# Patient Record
Sex: Female | Born: 1985 | Race: White | Hispanic: No | Smoking: Former smoker
Health system: Southern US, Community
[De-identification: ages and names within clinical notes are randomized; demographics above are authoritative.]

---

## 2010-07-30 ENCOUNTER — Emergency Department: Payer: Self-pay | Admitting: Internal Medicine

## 2011-09-23 IMAGING — CR DG LUMBAR SPINE 2-3V
1 series · 3 of 3 positions shown · non-contrast
Comparison: none

REASON FOR EXAM: back pain
COMMENTS:   May transport without cardiac monitor

PROCEDURE:     DXR - DXR LUMBAR SPINE AP AND LATERAL  - July 30, 2010  [DATE]
RESULT:     The vertebral body heights and the intervertebral disc spaces
are well maintained. The vertebral body alignment is normal. The pedicles
are bilaterally intact.

[Series 1: view not recorded · 0.17mm/px · 3 of 3 slices shown]
[im 1/3]
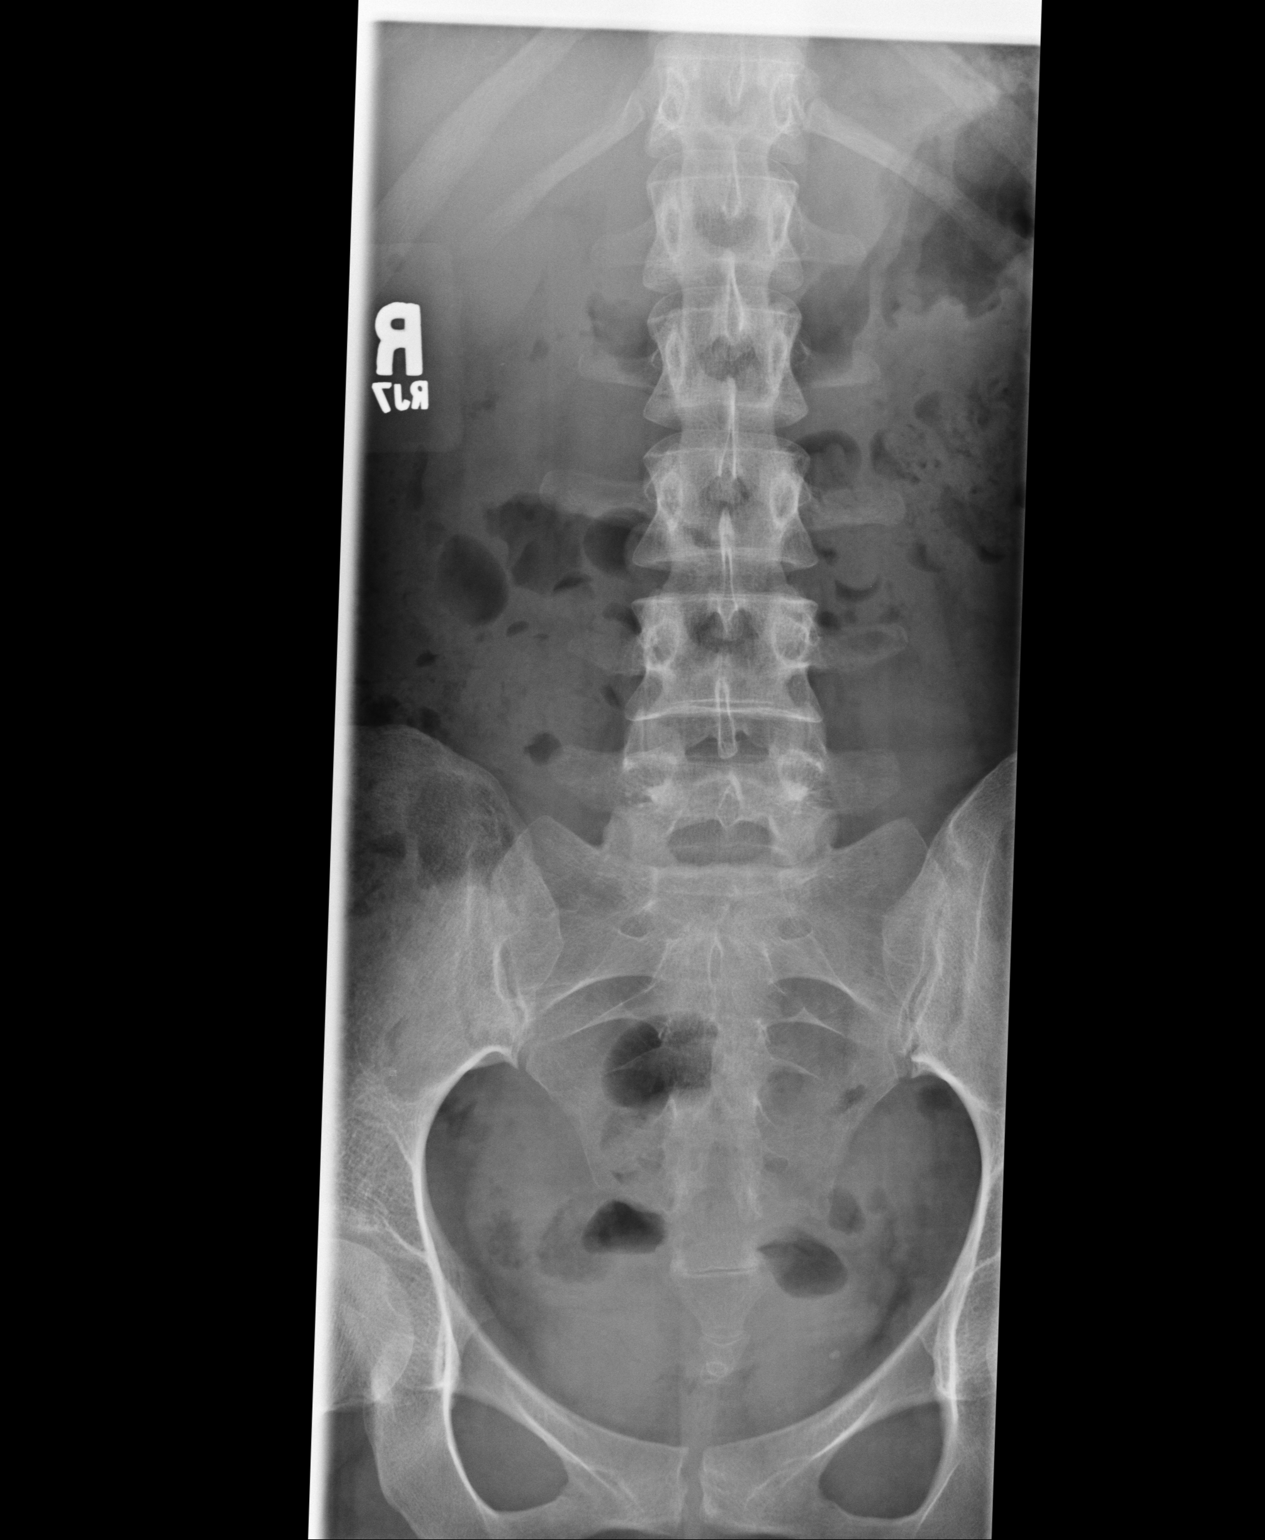
[im 2/3]
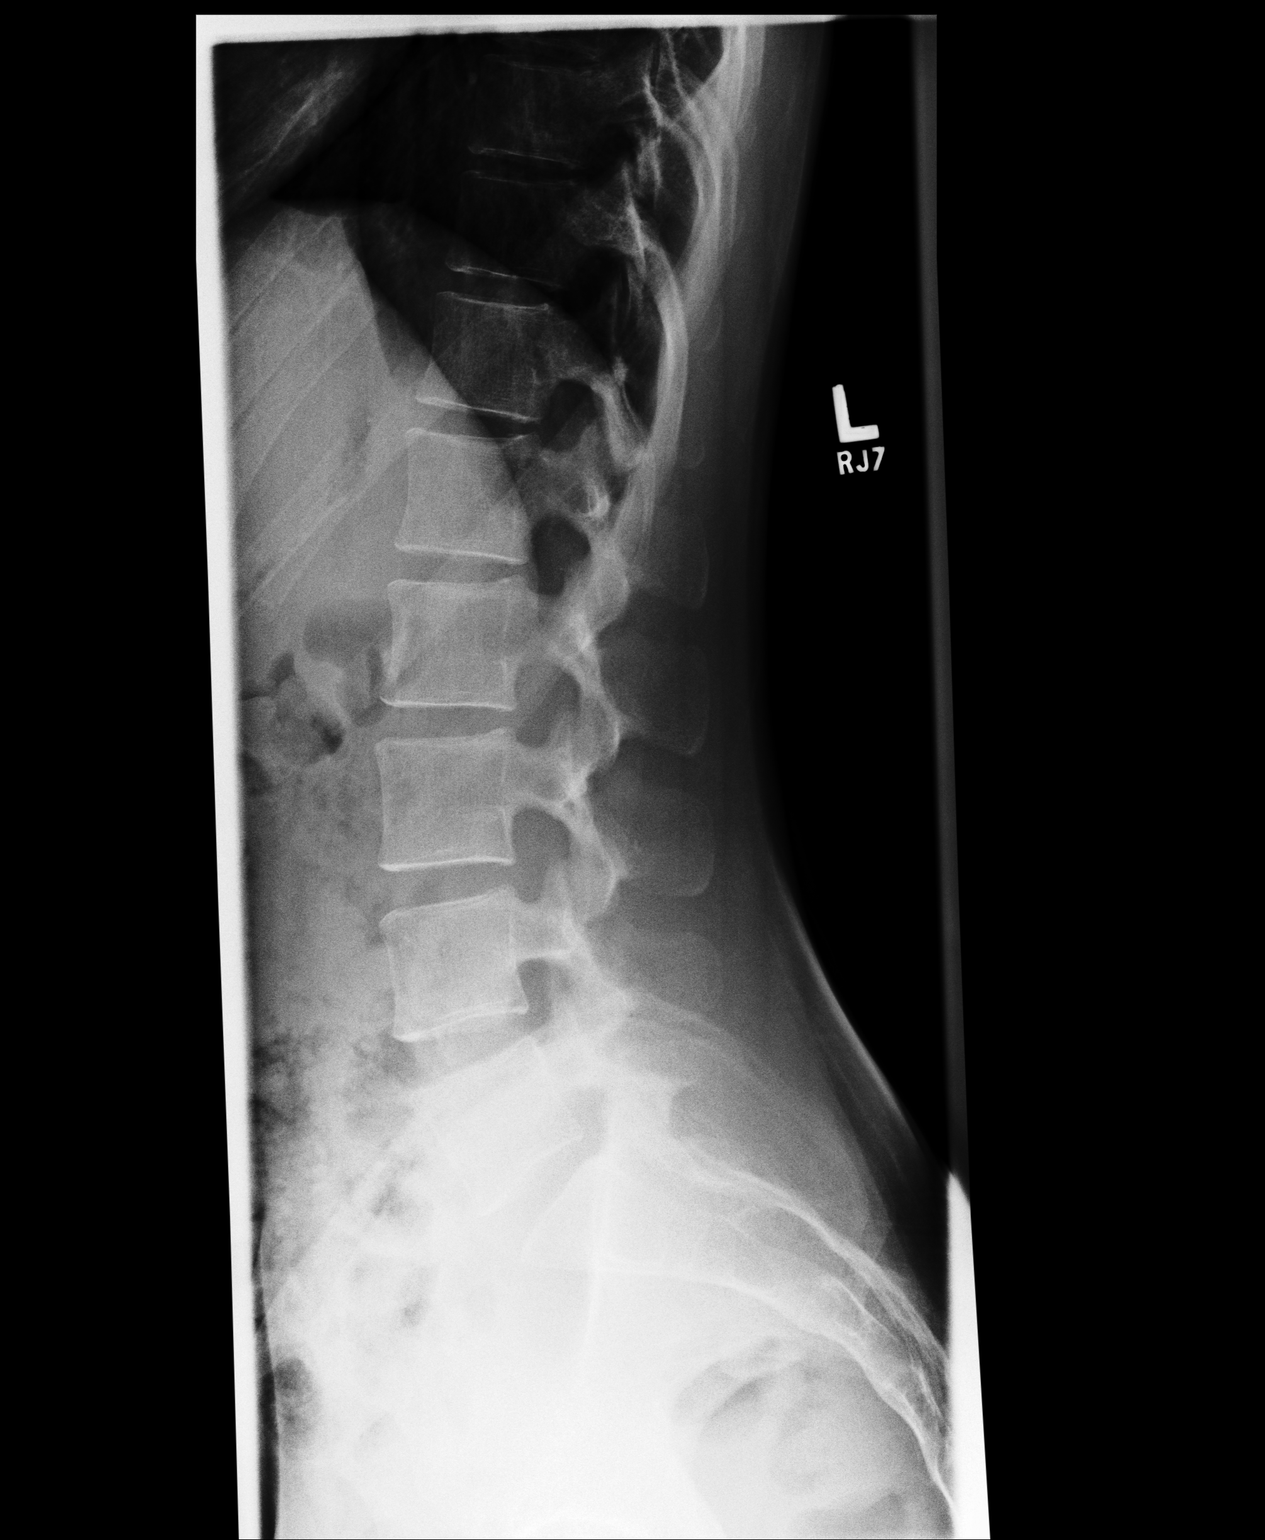
[im 3/3]
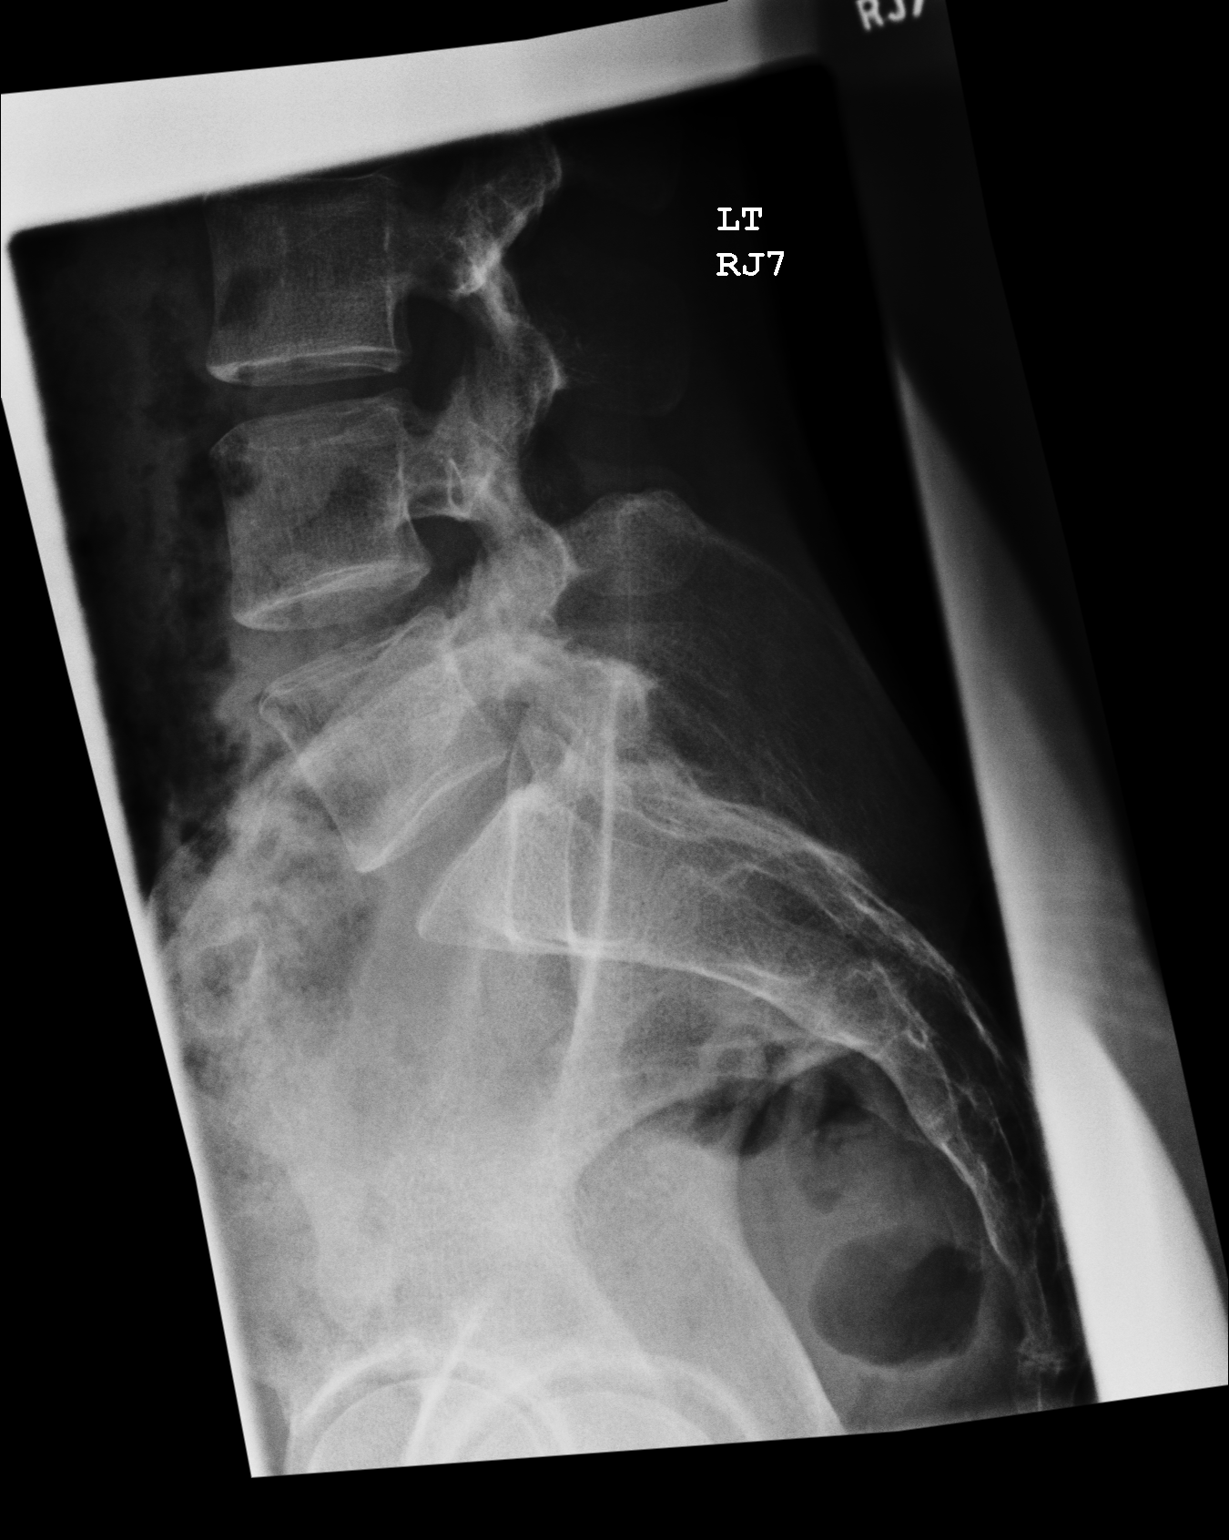

[3 of 3 positions shown; findings below may reference images not displayed]

IMPRESSION: 1.     No significant abnormalities are noted.

## 2011-10-18 ENCOUNTER — Emergency Department: Payer: Self-pay | Admitting: Emergency Medicine

## 2011-10-18 LAB — URINALYSIS, COMPLETE
Bilirubin,UR: NEGATIVE
Ketone: NEGATIVE
Nitrite: NEGATIVE
RBC,UR: 242 /HPF (ref 0–5)
Specific Gravity: 1.017 (ref 1.003–1.030)
Squamous Epithelial: 4
WBC UR: 2011 /HPF (ref 0–5)

## 2013-09-05 ENCOUNTER — Encounter (HOSPITAL_COMMUNITY): Payer: Self-pay | Admitting: Emergency Medicine

## 2013-09-05 ENCOUNTER — Emergency Department (HOSPITAL_COMMUNITY)
Admission: EM | Admit: 2013-09-05 | Discharge: 2013-09-05 | Disposition: A | Discharge status: Home / Self Care | Payer: Self-pay | Attending: Emergency Medicine | Admitting: Emergency Medicine

## 2013-09-05 DIAGNOSIS — S51809A Unspecified open wound of unspecified forearm, initial encounter: Secondary | ICD-10-CM | POA: Insufficient documentation

## 2013-09-05 DIAGNOSIS — F17200 Nicotine dependence, unspecified, uncomplicated: Secondary | ICD-10-CM | POA: Insufficient documentation

## 2013-09-05 DIAGNOSIS — S41119A Laceration without foreign body of unspecified upper arm, initial encounter: Secondary | ICD-10-CM

## 2013-09-05 DIAGNOSIS — S51819A Laceration without foreign body of unspecified forearm, initial encounter: Secondary | ICD-10-CM
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
## 2013-09-05 NOTE — Discharge Instructions (Signed)
Take ibuprofen with food, up to 800mg  three times a day, as needed for pain.  Keep wound clean and dry.  Follow up with your primary care doctor or [facility] [phone]; [address]) in 7-10 days for wound recheck and suture removal.  You should be seen sooner if you develop fever, worsening pain or redness/drainage of pus at site of wound.     Laceration Care, Adult A laceration is a cut or lesion that goes through all layers of the skin and into the tissue just beneath the skin. TREATMENT  Some lacerations may not require closure. Some lacerations may not be able to be closed due to an increased risk of infection. It is important to see your caregiver as soon as possible after an injury to minimize the risk of infection and maximize the opportunity for successful closure. If closure is appropriate, pain medicines may be given, if needed. The wound will be cleaned to help prevent infection. Your caregiver will use stitches (sutures), staples, wound glue (adhesive), or skin adhesive strips to repair the laceration. These tools bring the skin edges together to allow for faster healing and a better cosmetic outcome. However, all wounds will heal with a scar. Once the wound has healed, scarring can be minimized by covering the wound with sunscreen during the day for 1 full year. HOME CARE INSTRUCTIONS  For sutures or staples:  Keep the wound clean and dry.  If you were given a bandage (dressing), you should change it at least once a day. Also, change the dressing if it becomes wet or dirty, or as directed by your caregiver.  Wash the wound with soap and water 2 times a day. Rinse the wound off with water to remove all soap. Pat the wound dry with a clean towel.  After cleaning, apply a thin layer of the antibiotic ointment as recommended by your caregiver. This will help prevent infection and keep the dressing from sticking.  You may shower as usual after the first 24 hours. Do not  soak the wound in water until the sutures are removed.  Only take over-the-counter or prescription medicines for pain, discomfort, or fever as directed by your caregiver.  Get your sutures or staples removed as directed by your caregiver. For skin adhesive strips:  Keep the wound clean and dry.  Do not get the skin adhesive strips wet. You may bathe carefully, using caution to keep the wound dry.  If the wound gets wet, pat it dry with a clean towel.  Skin adhesive strips will fall off on their own. You may trim the strips as the wound heals. Do not remove skin adhesive strips that are still stuck to the wound. They will fall off in time. For wound adhesive:  You may briefly wet your wound in the shower or bath. Do not soak or scrub the wound. Do not swim. Avoid periods of heavy perspiration until the skin adhesive has fallen off on its own. After showering or bathing, gently pat the wound dry with a clean towel.  Do not apply liquid medicine, cream medicine, or ointment medicine to your wound while the skin adhesive is in place. This may loosen the film before your wound is healed.  If a dressing is placed over the wound, be careful not to apply tape directly over the skin adhesive. This may cause the adhesive to be pulled off before the wound is healed.  Avoid prolonged exposure to sunlight or tanning  lamps while the skin adhesive is in place. Exposure to ultraviolet light in the first year will darken the scar.  The skin adhesive will usually remain in place for 5 to 10 days, then naturally fall off the skin. Do not pick at the adhesive film. You may need a tetanus shot if:  You cannot remember when you had your last tetanus shot.  You have never had a tetanus shot. If you get a tetanus shot, your arm may swell, get red, and feel warm to the touch. This is common and not a problem. If you need a tetanus shot and you choose not to have one, there is a rare chance of getting tetanus.  Sickness from tetanus can be serious. SEEK MEDICAL CARE IF:   You have redness, swelling, or increasing pain in the wound.  You see a red line that goes away from the wound.  You have yellowish-white fluid (pus) coming from the wound.  You have a fever.  You notice a bad smell coming from the wound or dressing.  Your wound breaks open before or after sutures have been removed.  You notice something coming out of the wound such as wood or glass.  Your wound is on your hand or foot and you cannot move a finger or toe. SEEK IMMEDIATE MEDICAL CARE IF:   Your pain is not controlled with prescribed medicine.  You have severe swelling around the wound causing pain and numbness or a change in color in your arm, hand, leg, or foot.  Your wound splits open and starts bleeding.  You have worsening numbness, weakness, or loss of function of any joint around or beyond the wound.  You develop painful lumps near the wound or on the skin anywhere on your body. MAKE SURE YOU:   Understand these instructions.  Will watch your condition.  Will get help right away if you are not doing well or get worse. Document Released: 04/12/2005 Document Revised: 07/05/2011 Document Reviewed: 10/06/2010 [facility]ExitCare Patient Information 2014 [employer] [place].

## 2013-09-06 NOTE — ED Provider Notes (Signed)
Medical screening examination/treatment/procedure(s) were conducted as a shared visit with non-physician practitioner(s) and myself.  I personally evaluated the patient during the encounter.   EKG Interpretation None     Neurovascular intact on right upper extremity.    Laceration repair per PA  Donnetta HutchingBrian Anaja Monts, MD 09/06/13 [phone]

## 2015-08-25 ENCOUNTER — Ambulatory Visit (INDEPENDENT_AMBULATORY_CARE_PROVIDER_SITE_OTHER): Payer: Commercial Managed Care - PPO | Admitting: Family Medicine

## 2015-08-25 VITALS — BP 107/71 | HR 69 | Temp 98.5°F | Resp 18 | Ht 66.0 in | Wt 125.2 lb

## 2015-08-25 DIAGNOSIS — K5900 Constipation, unspecified: Secondary | ICD-10-CM | POA: Diagnosis not present

## 2015-08-25 DIAGNOSIS — N3090 Cystitis, unspecified without hematuria: Secondary | ICD-10-CM

## 2015-08-25 DIAGNOSIS — R059 Cough, unspecified: Secondary | ICD-10-CM

## 2015-08-25 DIAGNOSIS — R05 Cough: Secondary | ICD-10-CM

## 2015-08-25 DIAGNOSIS — R1031 Right lower quadrant pain: Secondary | ICD-10-CM | POA: Diagnosis not present

## 2015-08-25 DIAGNOSIS — R300 Dysuria: Secondary | ICD-10-CM

## 2015-08-25 LAB — POCT URINALYSIS DIP (MANUAL ENTRY)
Bilirubin, UA: NEGATIVE
Glucose, UA: NEGATIVE
Ketones, POC UA: NEGATIVE
Nitrite, UA: POSITIVE — AB
PH UA: 5.5
Protein Ur, POC: >=300 — AB
Spec Grav, UA: 1.02
UROBILINOGEN UA: 0.2

## 2015-08-25 LAB — POCT CBC
Granulocyte percent: 57.5 %G (ref 37–80)
HCT, POC: 44.1 % (ref 37.7–47.9)
Hemoglobin: 15.5 g/dL (ref 12.2–16.2)
LYMPH, POC: 2.7 (ref 0.6–3.4)
MCH, POC: 32.4 pg — AB (ref 27–31.2)
MCHC: 35.1 g/dL (ref 31.8–35.4)
MCV: 92.1 fL (ref 80–97)
MID (cbc): 0.7 (ref 0–0.9)
MPV: 7.8 fL (ref 0–99.8)
POC Granulocyte: 4.6 (ref 2–6.9)
POC LYMPH %: 33.5 % (ref 10–50)
POC MID %: 9 % (ref 0–12)
Platelet Count, POC: 164 10*3/uL (ref 142–424)
RBC: 4.79 M/uL (ref 4.04–5.48)
RDW, POC: 12.7 %
WBC: 8 10*3/uL (ref 4.6–10.2)

## 2015-08-25 LAB — POC MICROSCOPIC URINALYSIS (UMFC): MUCUS RE: ABSENT

## 2015-08-25 LAB — POCT URINE PREGNANCY: PREG TEST UR: NEGATIVE

## 2015-08-25 MED ORDER — BENZONATATE 100 MG PO CAPS
100.0000 mg | ORAL_CAPSULE | Freq: Three times a day (TID) | ORAL | Status: AC | PRN
Start: 2015-08-25 — End: ?

## 2015-08-25 MED ORDER — PHENAZOPYRIDINE HCL 200 MG PO TABS: 200.0000 mg | ORAL_TABLET | Freq: Three times a day (TID) | ORAL | Status: AC | PRN

## 2015-08-25 MED ORDER — CEPHALEXIN 500 MG PO CAPS: 500.0000 mg | ORAL_CAPSULE | Freq: Four times a day (QID) | ORAL | Status: AC

## 2015-08-25 NOTE — Patient Instructions (Addendum)
  Drink lots of fluids  Take cephalexin 3 times daily for infection. This is excellent for most urinary infections, and also will help many respiratory tract infections if you have a bacterial component to this  Take Pyridium 200 mg 1 pill 3 times daily for a couple of days to try and reduce your symptoms until the antibiotic is doing its job. This medicine is excreted heavily through the urine and make sure urine a dark orangeish brown color.  Take benzonatate cough pills one or 2 pills as needed for daytime cough. This is nonsedating.  Return if worse   IF you received an x-ray today, you will receive an invoice from [facility]. Please contact [facility] at [phone] with questions or concerns regarding your invoice.   IF you received labwork today, you will receive an invoice from [facility]. Please contact [facility] at [phone] with questions or concerns regarding your invoice.   Our billing staff will not be able to assist you with questions regarding bills from these companies.  You will be contacted with the lab results as soon as they are available. The fastest way to get your results is to activate your My Chart account. Instructions are located on the last page of this paperwork. If you have not heard from us regarding the results in 2 weeks, please contact this office.
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
## 2015-08-25 NOTE — Addendum Note (Signed)
Addended by: Eddie CandleLUCK, Tyre Beaver L on: 08/25/2015 07:13 PM   Modules accepted: Kipp BroodSmartSet

## 2015-08-28 LAB — URINE CULTURE: Colony Count: >=100000

## 2018-12-26 DEATH — deceased
